# Patient Record
Sex: Female | Born: 1975
Health system: Southern US, Community
[De-identification: ages and names within clinical notes are randomized; demographics above are authoritative.]

## PROBLEM LIST (undated history)

## (undated) DIAGNOSIS — I10 Essential (primary) hypertension: Secondary | ICD-10-CM

## (undated) HISTORY — DX: Essential (primary) hypertension: I10

---

## 2016-11-03 NOTE — L&D Delivery Note (Addendum)
Delivery Note At 7:07 PM a viable female "Karina Underwood" was delivered precipitously via Vaginal, Spontaneous Delivery by Bernerd PhoNancy Dala Breault CNM (Presentation: OA). APGARS: 9, 9; weight 8 lbs 12.8 oz (3992 g).   Placenta status: Spontaneous, intact. Cord: 3 vessels with the following complications: Loose nuchal, manually reduced. Cord pH: NA  Anesthesia: Local for repair Episiotomy: NA  Lacerations: 1st degree   Suture Repair: 2-0 Vicryl Est. Blood Loss (mL): 200  Mom to postpartum. Baby to Couplet care / Skin to Skin.  Sherre ScarletWILLIAMS, KIMBERLY 01/23/2017, 7:13 PM

## 2017-01-23 ENCOUNTER — Encounter (HOSPITAL_COMMUNITY): Payer: Self-pay | Admitting: *Deleted

## 2017-01-23 ENCOUNTER — Inpatient Hospital Stay (HOSPITAL_COMMUNITY)
Admission: AD | Admit: 2017-01-23 | Discharge: 2017-01-25 | DRG: 774 | Disposition: A | Discharge status: Home / Self Care | Payer: BLUE CROSS/BLUE SHIELD | Source: Ambulatory Visit | Attending: Obstetrics and Gynecology | Admitting: Obstetrics and Gynecology

## 2017-01-23 DIAGNOSIS — O99824 Streptococcus B carrier state complicating childbirth: Secondary | ICD-10-CM | POA: Diagnosis present

## 2017-01-23 DIAGNOSIS — O480 Post-term pregnancy: Secondary | ICD-10-CM

## 2017-01-23 DIAGNOSIS — A6000 Herpesviral infection of urogenital system, unspecified: Secondary | ICD-10-CM | POA: Diagnosis present

## 2017-01-23 DIAGNOSIS — Z6791 Unspecified blood type, Rh negative: Secondary | ICD-10-CM

## 2017-01-23 DIAGNOSIS — O134 Gestational [pregnancy-induced] hypertension without significant proteinuria, complicating childbirth: Secondary | ICD-10-CM | POA: Diagnosis present

## 2017-01-23 DIAGNOSIS — O9832 Other infections with a predominantly sexual mode of transmission complicating childbirth: Secondary | ICD-10-CM | POA: Diagnosis present

## 2017-01-23 DIAGNOSIS — Z3A40 40 weeks gestation of pregnancy: Secondary | ICD-10-CM

## 2017-01-23 DIAGNOSIS — O09519 Supervision of elderly primigravida, unspecified trimester: Secondary | ICD-10-CM

## 2017-01-23 DIAGNOSIS — B009 Herpesviral infection, unspecified: Secondary | ICD-10-CM

## 2017-01-23 DIAGNOSIS — Z3493 Encounter for supervision of normal pregnancy, unspecified, third trimester: Secondary | ICD-10-CM | POA: Diagnosis present

## 2017-01-23 DIAGNOSIS — E669 Obesity, unspecified: Secondary | ICD-10-CM

## 2017-01-23 DIAGNOSIS — O26899 Other specified pregnancy related conditions, unspecified trimester: Secondary | ICD-10-CM

## 2017-01-23 DIAGNOSIS — B951 Streptococcus, group B, as the cause of diseases classified elsewhere: Secondary | ICD-10-CM

## 2017-01-23 DIAGNOSIS — O479 False labor, unspecified: Secondary | ICD-10-CM | POA: Diagnosis present

## 2017-01-23 DIAGNOSIS — O99214 Obesity complicating childbirth: Secondary | ICD-10-CM | POA: Diagnosis present

## 2017-01-23 DIAGNOSIS — O139 Gestational [pregnancy-induced] hypertension without significant proteinuria, unspecified trimester: Secondary | ICD-10-CM

## 2017-01-23 LAB — CBC
HCT: 38.3 % (ref 36.0–46.0)
Hemoglobin: 12.6 g/dL (ref 12.0–15.0)
MCH: 29.9 pg (ref 26.0–34.0)
MCHC: 32.9 g/dL (ref 30.0–36.0)
MCV: 90.8 fL (ref 78.0–100.0)
Platelets: 236 10*3/uL (ref 150–400)
RBC: 4.22 MIL/uL (ref 3.87–5.11)
RDW: 14.1 % (ref 11.5–15.5)
WBC: 21.2 10*3/uL — ABNORMAL HIGH (ref 4.0–10.5)

## 2017-01-23 LAB — OB RESULTS CONSOLE RUBELLA ANTIBODY, IGM: Rubella: IMMUNE

## 2017-01-23 LAB — OB RESULTS CONSOLE ANTIBODY SCREEN: Antibody Screen: POSITIVE

## 2017-01-23 LAB — OB RESULTS CONSOLE GBS: GBS: POSITIVE

## 2017-01-23 LAB — TYPE AND SCREEN
ABO/RH(D): A NEG
Antibody Screen: NEGATIVE

## 2017-01-23 LAB — OB RESULTS CONSOLE ABO/RH: RH Type: NEGATIVE

## 2017-01-23 LAB — OB RESULTS CONSOLE GC/CHLAMYDIA
Chlamydia: NEGATIVE
Gonorrhea: NEGATIVE

## 2017-01-23 MED ORDER — ONDANSETRON HCL 4 MG/2ML IJ SOLN: 4.0000 mg | INTRAMUSCULAR | Status: DC | PRN

## 2017-01-23 MED ORDER — WITCH HAZEL-GLYCERIN EX PADS: 1.0000 "application " | MEDICATED_PAD | CUTANEOUS | Status: DC | PRN

## 2017-01-23 MED ORDER — ACETAMINOPHEN 325 MG PO TABS: 650.0000 mg | ORAL_TABLET | ORAL | Status: DC | PRN

## 2017-01-23 MED ORDER — OXYTOCIN 40 UNITS IN LACTATED RINGERS INFUSION - SIMPLE MED
2.5000 [IU]/h | INTRAVENOUS | Status: DC
  Administered 2017-01-23: 2.5 [IU]/h via INTRAVENOUS

## 2017-01-23 MED ORDER — LIDOCAINE HCL (PF) 1 % IJ SOLN
INTRAMUSCULAR | Status: AC
  Administered 2017-01-23: 30 mL via SUBCUTANEOUS
  Filled 2017-01-23: qty 30

## 2017-01-23 MED ORDER — IBUPROFEN 600 MG PO TABS
600.0000 mg | ORAL_TABLET | Freq: Four times a day (QID) | ORAL | Status: DC
  Administered 2017-01-24 – 2017-01-25 (×3): 600 mg via ORAL
  Filled 2017-01-23 (×6): qty 1

## 2017-01-23 MED ORDER — SENNOSIDES-DOCUSATE SODIUM 8.6-50 MG PO TABS
2.0000 | ORAL_TABLET | ORAL | Status: DC
  Administered 2017-01-24: 2 via ORAL
  Filled 2017-01-23 (×2): qty 2

## 2017-01-23 MED ORDER — LIDOCAINE HCL (PF) 1 % IJ SOLN
30.0000 mL | INTRAMUSCULAR | Status: DC | PRN
  Administered 2017-01-23: 30 mL via SUBCUTANEOUS
  Filled 2017-01-23: qty 30

## 2017-01-23 MED ORDER — DIPHENHYDRAMINE HCL 25 MG PO CAPS: 25.0000 mg | ORAL_CAPSULE | Freq: Four times a day (QID) | ORAL | Status: DC | PRN

## 2017-01-23 MED ORDER — BENZOCAINE-MENTHOL 20-0.5 % EX AERO
1.0000 "application " | INHALATION_SPRAY | CUTANEOUS | Status: DC | PRN
  Filled 2017-01-23: qty 56

## 2017-01-23 MED ORDER — OXYCODONE-ACETAMINOPHEN 5-325 MG PO TABS: 2.0000 | ORAL_TABLET | ORAL | Status: DC | PRN

## 2017-01-23 MED ORDER — ONDANSETRON HCL 4 MG/2ML IJ SOLN: 4.0000 mg | Freq: Four times a day (QID) | INTRAMUSCULAR | Status: DC | PRN

## 2017-01-23 MED ORDER — SOD CITRATE-CITRIC ACID 500-334 MG/5ML PO SOLN: 30.0000 mL | ORAL | Status: DC | PRN

## 2017-01-23 MED ORDER — OXYCODONE-ACETAMINOPHEN 5-325 MG PO TABS: 1.0000 | ORAL_TABLET | ORAL | Status: DC | PRN

## 2017-01-23 MED ORDER — ONDANSETRON HCL 4 MG PO TABS: 4.0000 mg | ORAL_TABLET | ORAL | Status: DC | PRN

## 2017-01-23 MED ORDER — LACTATED RINGERS IV SOLN: 500.0000 mL | INTRAVENOUS | Status: DC | PRN

## 2017-01-23 MED ORDER — LACTATED RINGERS IV SOLN
INTRAVENOUS | Status: DC
  Administered 2017-01-23: 125 mL/h via INTRAVENOUS

## 2017-01-23 MED ORDER — TETANUS-DIPHTH-ACELL PERTUSSIS 5-2.5-18.5 LF-MCG/0.5 IM SUSP: 0.5000 mL | Freq: Once | INTRAMUSCULAR | Status: DC

## 2017-01-23 MED ORDER — SODIUM CHLORIDE 0.9 % IV SOLN
2.0000 g | Freq: Once | INTRAVENOUS | Status: AC
  Administered 2017-01-23: 2 g via INTRAVENOUS
  Filled 2017-01-23: qty 2000

## 2017-01-23 MED ORDER — PRENATAL MULTIVITAMIN CH
1.0000 | ORAL_TABLET | Freq: Every day | ORAL | Status: DC
  Administered 2017-01-24 – 2017-01-25 (×2): 1 via ORAL
  Filled 2017-01-23 (×2): qty 1

## 2017-01-23 MED ORDER — ZOLPIDEM TARTRATE 5 MG PO TABS: 5.0000 mg | ORAL_TABLET | Freq: Every evening | ORAL | Status: DC | PRN

## 2017-01-23 MED ORDER — OXYTOCIN BOLUS FROM INFUSION
500.0000 mL | Freq: Once | INTRAVENOUS | Status: AC
  Administered 2017-01-23: 500 mL via INTRAVENOUS

## 2017-01-23 MED ORDER — COCONUT OIL OIL
1.0000 "application " | TOPICAL_OIL | Status: DC | PRN
  Administered 2017-01-24: 1 via TOPICAL
  Filled 2017-01-23: qty 120

## 2017-01-23 MED ORDER — DIBUCAINE 1 % RE OINT: 1.0000 "application " | TOPICAL_OINTMENT | RECTAL | Status: DC | PRN

## 2017-01-23 MED ORDER — OXYTOCIN 40 UNITS IN LACTATED RINGERS INFUSION - SIMPLE MED
INTRAVENOUS | Status: AC
  Administered 2017-01-23: 2.5 [IU]/h via INTRAVENOUS
  Filled 2017-01-23: qty 1000

## 2017-01-23 MED ORDER — SIMETHICONE 80 MG PO CHEW: 80.0000 mg | CHEWABLE_TABLET | ORAL | Status: DC | PRN

## 2017-01-23 NOTE — MAU Note (Signed)
Patient brought straight to triage from lobby. Contractions every 2 minutes; bloody show. Denies LOF. SVE 9cm BBOW. Patient taken directly to admit bed; MD called and made aware of patient arrival and status. FHR 150's.

## 2017-01-23 NOTE — Progress Notes (Signed)
Delivery of live viable female by Fran LowesNancy P, CNM

## 2017-01-23 NOTE — H&P (Signed)
Karina Underwood is a 41 y.o. female, G3P1011 at 40.6 weeks, presenting for active labor.  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  FM+  Denies leaking of fluid  Patient Active Problem List   Diagnosis Date Noted  . Threatened labor at term 01/23/2017  . Vaginal delivery 01/23/2017    History of present pregnancy: Patient entered care at 9.2 weeks.   EDC of 01/17/2017 was established by LMP.   Anatomy scan:  19.6 weeks, with normal findings and an posterior placenta.   Additional US evaluations:  None.   Significant prenatal events:  None   Last evaluation:  Last week  OB History    Gravida Para Term Preterm AB Living   1             SAB TAB Ectopic Multiple Live Births                 No past medical history on file. No past surgical history on file. Family History: family history is not on file. Social History:  has no tobacco, alcohol, and drug history on file.   Prenatal Transfer Tool  Maternal Diabetes: No Genetic Screening: Declined Maternal Ultrasounds/Referrals: Normal Fetal Ultrasounds or other Referrals:  None Maternal Substance Abuse:  No Significant Maternal Medications:  Meds include: Other: Valtrex Significant Maternal Lab Results: None  TDAP No Flu No  ROS:  Reviewed all 10 systems and negative expect as stated above  No Known Allergies   Dilation: 10 Station: Crowning Exam by:: CNM Karina Underwood Blood pressure (!) 155/97, pulse 80, resp. rate 20.  Chest clear Heart RRR without murmur Abd gravid, NT, FH appropriate Pelvic: adequate Ext: NEg  FHR: Category 1 UCs:  3 in 10 minutes  Prenatal labs: ABO, Rh:  A ngative  Antibody:  Neg Rubella:  Immune RPR:   NR HBsAg:   Neg HIV:   NR GBS:  Positive  Pap:  2016 negative GC:  Neg Chlamydia:  Neg Genetic screenings:  declined Glucola:  112 Other:   Hgb 12.9 at NOB, 11.2 at 28 weeks       Assessment/Plan: IUP at 40.6  Cat 1 strip GBS  positive  Plan: Admit to Birthing Suite per consult with Rivard Routine [facility] orders  PCN G for GBS prophylaxis Ampicillin  Henderson NewcomerNancy Jean ProtheroCNM, MSN 01/23/2017, 7:25 PM

## 2017-01-23 NOTE — Progress Notes (Signed)
Dr Estanislado Pandyivard notified that patient is a G1P0 at 36109w6d and here 9cm dilated with a BBOW.  Dr Estanislado Pandyivard will call the midwife to deliver and patient headed to labor and delivery.

## 2017-01-24 ENCOUNTER — Encounter (HOSPITAL_COMMUNITY): Payer: Self-pay | Admitting: *Deleted

## 2017-01-24 LAB — COMPREHENSIVE METABOLIC PANEL
ALT: 16 U/L (ref 14–54)
AST: 30 U/L (ref 15–41)
Albumin: 2.6 g/dL — ABNORMAL LOW (ref 3.5–5.0)
Alkaline Phosphatase: 90 U/L (ref 38–126)
Anion gap: 9 (ref 5–15)
BUN: 12 mg/dL (ref 6–20)
CO2: 21 mmol/L — ABNORMAL LOW (ref 22–32)
Calcium: 8.6 mg/dL — ABNORMAL LOW (ref 8.9–10.3)
Chloride: 103 mmol/L (ref 101–111)
Creatinine, Ser: 0.68 mg/dL (ref 0.44–1.00)
GFR calc Af Amer: >60 mL/min (ref >60)
GFR calc non Af Amer: >60 mL/min (ref >60)
Glucose, Bld: 127 mg/dL — ABNORMAL HIGH (ref 65–99)
Potassium: 3.9 mmol/L (ref 3.5–5.1)
Sodium: 133 mmol/L — ABNORMAL LOW (ref 135–145)
Total Bilirubin: 0.3 mg/dL (ref 0.3–1.2)
Total Protein: 6.3 g/dL — ABNORMAL LOW (ref 6.5–8.1)

## 2017-01-24 LAB — CBC
HCT: 32.7 % — ABNORMAL LOW (ref 36.0–46.0)
Hemoglobin: 11 g/dL — ABNORMAL LOW (ref 12.0–15.0)
MCH: 30.4 pg (ref 26.0–34.0)
MCHC: 33.6 g/dL (ref 30.0–36.0)
MCV: 90.3 fL (ref 78.0–100.0)
Platelets: 207 10*3/uL (ref 150–400)
RBC: 3.62 MIL/uL — ABNORMAL LOW (ref 3.87–5.11)
RDW: 14 % (ref 11.5–15.5)
WBC: 16.1 10*3/uL — ABNORMAL HIGH (ref 4.0–10.5)

## 2017-01-24 LAB — LACTATE DEHYDROGENASE: LDH: 142 U/L (ref 98–192)

## 2017-01-24 LAB — RPR: RPR Ser Ql: NONREACTIVE

## 2017-01-24 LAB — URIC ACID: Uric Acid, Serum: 5.9 mg/dL (ref 2.3–6.6)

## 2017-01-24 LAB — ABO/RH: ABO/RH(D): A NEG

## 2017-01-24 MED ORDER — RHO D IMMUNE GLOBULIN 1500 UNIT/2ML IJ SOSY
300.0000 ug | PREFILLED_SYRINGE | Freq: Once | INTRAMUSCULAR | Status: AC
  Administered 2017-01-24: 300 ug via INTRAMUSCULAR
  Filled 2017-01-24: qty 2

## 2017-01-24 NOTE — Lactation Note (Signed)
This note was copied from a baby's chart. Lactation Consultation Note  Patient Name: Karina Underwood XBJYN'WToday's Date: 01/24/2017 Reason for consult: Initial assessment  Visited with Mom, baby 5518 hrs old and has had >4 feedings at breast so far.  Mom with visitors in room, and would like to have LC observe and assess a feeding.  Mom to call back at next feeding. Brochure left with Mom.     Consult Status Consult Status: Follow-up Date: 01/24/17 Follow-up type: In-patient    Judee ClaraSmith, Darene Nappi E 01/24/2017, 1:24 PM

## 2017-01-24 NOTE — Lactation Note (Signed)
This note was copied from a baby's chart. Lactation Consultation Note  Patient Name: Karina Perley JainLisa Cull JXBJY'NToday's Date: 01/24/2017 Reason for consult: Follow-up assessment;Difficult latch  Baby 27 hours old. Called to room to assess baby's oral anatomy and latch. Mom reports that her nipples are very sore, and mom's left nipple has two blisters and is pinched/lipstick tip. Assisted mom to latch baby to left breast in football position and baby latched deeply, suckled rhythmically with lips flanged, but quickly became tired at breast and slipped to the tip of mom's nipple. Discussed with parents that baby's tongue appears to be restricted. FOB showed this LC his tongue and stated that his tongue is very tight. Enc parents to discuss with pediatrician in the morning, and especially if baby still having trouble when mom's milk comes to volume. Enc parents to practice tummy time to encourage relaxing of muscles.   Discussed the need to pump to protect mom's milk supply. Set mom up with DEBP and demonstrated how to assemble, disassemble and clean parts. Mom states that she has a personal pump, and she is aware of the benefits of a hospital-grade pump--and hospital rental. Assisted mom with hand expression with no colostrum present, and mom not flowing during pumping. Plan is for mom to put baby to breast with cues, then supplement with EBM/formula according to guidelines--which were given with review. Enc mom to post-pump followed by hand expression.   Discussed assessment and intervention with patient's bedside nurse, Jan, RN.   Maternal Data Has patient been taught Hand Expression?: Yes Does the patient have breastfeeding experience prior to this delivery?: No  Feeding Feeding Type: Breast Fed Length of feed: 5 min  LATCH Score/Interventions Latch: Repeated attempts needed to sustain latch, nipple held in mouth throughout feeding, stimulation needed to elicit sucking reflex. Intervention(s): Adjust  position;Assist with latch;Breast compression  Audible Swallowing: A few with stimulation Intervention(s): Skin to skin;Hand expression  Type of Nipple: Everted at rest and after stimulation  Comfort (Breast/Nipple): Filling, red/small blisters or bruises, mild/mod discomfort  Problem noted: Cracked, bleeding, blisters, bruises Interventions  (Cracked/bleeding/bruising/blister): Double electric pump  Hold (Positioning): Assistance needed to correctly position infant at breast and maintain latch. Intervention(s): Breastfeeding basics reviewed;Support Pillows;Position options;Skin to skin  LATCH Score: 6  Lactation Tools Discussed/Used WIC Program: No Pump Review: Setup, frequency, and cleaning;Milk Storage Initiated by:: JW Date initiated:: 01/24/17   Consult Status Consult Status: Follow-up Date: 01/25/17 Follow-up type: In-patient    Sherlyn HayJennifer D Keishana Klinger 01/24/2017, 10:39 PM

## 2017-01-24 NOTE — Progress Notes (Signed)
Spoke with Bernerd PhoNancy Prothero, CNM and inquired whether or not we still needed the protein-creatinine ratio. Was instructed that since other labs were ok and blood pressures have not been excessively high, that she no longer needed the protein creatinine ratio. Order discontinued. Sherald BargeMatthews, Eliza Grissinger L

## 2017-01-24 NOTE — Progress Notes (Addendum)
Subjective: Postpartum Day 1: Vaginal delivery, 1st vaginal laceration Patient up ad lib, reports no syncope or dizziness.  Denies headache, blurred vision or epigastric pain Feeding:  Breastfeeding Contraceptive plan:  Undecided  Objective: Vital signs in last 24 hours: Temp:  [97.8 F (36.6 C)-99 F (37.2 C)] 99 F (37.2 C) (03/24 0601) Pulse Rate:  [64-87] 67 (03/24 0601) Resp:  [16-22] 18 (03/24 0601) BP: (125-155)/(62-97) 141/76 (03/24 0601) Weight:  [99.8 kg (220 lb)] 99.8 kg (220 lb) (03/23 2000)  Physical Exam:  General: alert, cooperative and no distress Lochia: appropriate Uterine Fundus: firm Perineum: healing well DVT Evaluation: No evidence of DVT seen on physical exam. Negative Homan's sign.   CBC Latest Ref Rng & Units 01/24/2017 01/23/2017  WBC 4.0 - 10.5 K/uL 16.1(H) 21.2(H)  Hemoglobin 12.0 - 15.0 g/dL 11.0(L) 12.6  Hematocrit 36.0 - 46.0 % 32.7(L) 38.3  Platelets 150 - 400 K/uL 207 236   CMP and UPC pending Results for orders placed or performed during the hospital encounter of 01/23/17 (from the past 24 hour(s))  CBC     Status: Abnormal   Collection Time: 01/23/17  8:22 PM  Result Value Ref Range   WBC 21.2 (H) 4.0 - 10.5 K/uL   RBC 4.22 3.87 - 5.11 MIL/uL   Hemoglobin 12.6 12.0 - 15.0 g/dL   HCT 09.838.3 11.936.0 - 14.746.0 %   MCV 90.8 78.0 - 100.0 fL   MCH 29.9 26.0 - 34.0 pg   MCHC 32.9 30.0 - 36.0 g/dL   RDW 82.914.1 56.211.5 - 13.015.5 %   Platelets 236 150 - 400 K/uL  Type and screen [facility]     Status: None   Collection Time: 01/23/17  8:22 PM  Result Value Ref Range   ABO/RH(D) A NEG    Antibody Screen NEG    Sample Expiration 01/26/2017   ABO/Rh     Status: None   Collection Time: 01/23/17  8:22 PM  Result Value Ref Range   ABO/RH(D) A NEG   CBC     Status: Abnormal   Collection Time: 01/24/17  5:45 AM  Result Value Ref Range   WBC 16.1 (H) 4.0 - 10.5 K/uL   RBC 3.62 (L) 3.87 - 5.11 MIL/uL   Hemoglobin 11.0 (L) 12.0 - 15.0 g/dL    HCT 86.532.7 (L) 78.436.0 - 46.0 %   MCV 90.3 78.0 - 100.0 fL   MCH 30.4 26.0 - 34.0 pg   MCHC 33.6 30.0 - 36.0 g/dL   RDW 69.614.0 29.511.5 - 28.415.5 %   Platelets 207 150 - 400 K/uL  Rh IG workup (includes ABO/Rh)     Status: None (Preliminary result)   Collection Time: 01/24/17  5:45 AM  Result Value Ref Range   Gestational Age(Wks) 39    ABO/RH(D) A NEG    Fetal Screen NEG    Unit Number [id number]    Blood Component Type RHIG    Unit division 00    Status of Unit ALLOCATED    Transfusion Status OK TO TRANSFUSE   Comprehensive metabolic panel     Status: Abnormal   Collection Time: 01/24/17  7:55 AM  Result Value Ref Range   Sodium 133 (L) 135 - 145 mmol/L   Potassium 3.9 3.5 - 5.1 mmol/L   Chloride 103 101 - 111 mmol/L   CO2 21 (L) 22 - 32 mmol/L   Glucose, Bld 127 (H) 65 - 99 mg/dL   BUN 12 6 - 20 mg/dL  Creatinine, Ser 0.68 0.44 - 1.00 mg/dL   Calcium 8.6 (L) 8.9 - 10.3 mg/dL   Total Protein 6.3 (L) 6.5 - 8.1 g/dL   Albumin 2.6 (L) 3.5 - 5.0 g/dL   AST 30 15 - 41 U/L   ALT 16 14 - 54 U/L   Alkaline Phosphatase 90 38 - 126 U/L   Total Bilirubin 0.3 0.3 - 1.2 mg/dL   GFR calc non Af Amer >60 >60 mL/min   GFR calc Af Amer >60 >60 mL/min   Anion gap 9 5 - 15  Lactate dehydrogenase     Status: None   Collection Time: 01/24/17  7:55 AM  Result Value Ref Range   LDH 142 98 - 192 U/L  Uric acid     Status: None   Collection Time: 01/24/17  7:55 AM  Result Value Ref Range   Uric Acid, Serum 5.9 2.3 - 6.6 mg/dL    Assessment/Plan: Status post vaginal delivery day 1. Stable Continue current care. Plan for discharge tomorrow, Breastfeeding and Lactation consult    Henderson Newcomer ProtheroCNM 01/24/2017, 8:51 AM

## 2017-01-25 ENCOUNTER — Encounter (HOSPITAL_COMMUNITY): Payer: Self-pay | Admitting: Obstetrics and Gynecology

## 2017-01-25 LAB — RH IG WORKUP (INCLUDES ABO/RH)
ABO/RH(D): A NEG
Fetal Screen: NEGATIVE
Gestational Age(Wks): 39
Unit division: 0

## 2017-01-25 NOTE — Lactation Note (Signed)
This note was copied from a baby's chart. Lactation Consultation Note: assisted mom with first breast feeding after frenotomy. Mom reports no pain with latch until baby slid some towards nipple. On and off a few times then nursing well with no pain except uterine cramping. Nipple slightly pinched after nursing but mom reports much better than before, Latched to other breast- again took a few attempts then nursed well for 15 min. Nipple slightly pinched but again mom states much better than before. Baby content after nursing. Mom states this is the only time she has been content after nursing- usually still hungry. Parents pleased they had procedure done here. No questions at present. Reviewed our phone number, OP appointments and BFSG as resources for support after DC. To call prn  Patient Name: Karina Underwood ZOXWR'UPerley Jainoday's Date: 01/25/2017 Reason for consult: Follow-up assessment   Maternal Data Formula Feeding for Exclusion: No Has patient been taught Hand Expression?: Yes  Feeding Feeding Type: Breast Fed Length of feed: 30 min  LATCH Score/Interventions Latch: Grasps breast easily, tongue down, lips flanged, rhythmical sucking.  Audible Swallowing: A few with stimulation  Type of Nipple: Everted at rest and after stimulation  Comfort (Breast/Nipple): Filling, red/small blisters or bruises, mild/mod discomfort  Problem noted: Mild/Moderate discomfort  Hold (Positioning): Assistance needed to correctly position infant at breast and maintain latch. Intervention(s): Breastfeeding basics reviewed;Support Pillows  LATCH Score: 7  Lactation Tools Discussed/Used WIC Program: No   Consult Status Consult Status: Complete    Pamelia HoitWeeks, Roselina Burgueno D 01/25/2017, 2:05 PM

## 2017-01-25 NOTE — Discharge Summary (Signed)
OB Discharge Summary     Patient Name: Perley JainLisa Damman DOB: 04/29/1976 MRN: [id number]  Date of admission: 01/23/2017 Delivering MD: Kenney HousemanPROTHERO, Kamal Jurgens JEAN   Date of discharge: 01/25/2017  Admitting diagnosis: ctx 2mins apart Intrauterine pregnancy: 1359w6d     Secondary diagnosis:  Principal Problem:   Vaginal delivery Active Problems:   Rh negative, maternal   AMA (advanced maternal age) primigravida 35+   Obesity   Positive GBS test   HSV-1 (herpes simplex virus 1) infection   Gestational hypertension  Additional problems: None     Discharge diagnosis: Term Pregnancy Delivered                                                                                                Post partum procedures:rhogam  Augmentation: AROM  Complications: None  Hospital course:  Onset of Labor With Vaginal Delivery     41 y.o. yo [id number] at 7659w6d was admitted in Active Labor on 01/23/2017. Patient had an uncomplicated labor course as follows:  Membrane Rupture Time/Date: 6:40 PM ,01/23/2017   Intrapartum Procedures: Episiotomy: None [1]                                         Lacerations:  1st degree [2]  Patient had a delivery of a Viable infant. 01/23/2017  Information for the patient's newborn:  Tiajuana AmassHardin, Girl Makinley [[id number]][[id number]]       Pateint had an uncomplicated postpartum course.  She is ambulating, tolerating a regular diet, passing flatus, and urinating well. Patient is discharged home in stable condition on 01/25/17.   Physical exam  Vitals:   01/24/17 0601 01/24/17 1230 01/24/17 1823 01/25/17 0519  BP: (!) 141/76 (!) 142/75 131/73 132/66  Pulse: 67 68 64 73  Resp: 18 20 20 18   Temp: 99 F (37.2 C) 98.2 F (36.8 C) 98 F (36.7 C) 98.3 F (36.8 C)  TempSrc:  Oral Oral Oral  SpO2:   99%   Weight:      Height:       General: alert, cooperative and no distress Lochia: appropriate Uterine Fundus: firm Incision:N/A DVT Evaluation: No evidence of DVT seen on physical exam. Negative  Homan's sign. Labs: Lab Results  Component Value Date   WBC 16.1 (H) 01/24/2017   HGB 11.0 (L) 01/24/2017   HCT 32.7 (L) 01/24/2017   MCV 90.3 01/24/2017   PLT 207 01/24/2017   CMP Latest Ref Rng & Units 01/24/2017  Glucose 65 - 99 mg/dL 130(Q127(H)  BUN 6 - 20 mg/dL 12  Creatinine 6.570.44 - 8.461.00 mg/dL 9.620.68  Sodium 952135 - 841145 mmol/L 133(L)  Potassium 3.5 - 5.1 mmol/L 3.9  Chloride 101 - 111 mmol/L 103  CO2 22 - 32 mmol/L 21(L)  Calcium 8.9 - 10.3 mg/dL 3.2(G8.6(L)  Total Protein 6.5 - 8.1 g/dL 6.3(L)  Total Bilirubin 0.3 - 1.2 mg/dL 0.3  Alkaline Phos 38 - 126 U/L 90  AST 15 - 41 U/L 30  ALT 14 - 54 U/L 16  Discharge instruction: per After Visit Summary and "Baby and Me Booklet".  After visit meds:  Allergies as of 01/25/2017   No Known Allergies     Medication List    STOP taking these medications   CALCIUM MAGNESIUM PO   valACYclovir 500 MG tablet Commonly known as:  VALTREX     TAKE these medications   prenatal multivitamin Tabs tablet Take 1 tablet by mouth 3 (three) times daily.   Vitamin D3 5000 units Caps Take 5,000 Units by mouth daily.       Diet: routine diet  Activity: Advance as tolerated. Pelvic rest for 6 weeks.   Outpatient follow up:6 weeks Follow up Appt:No future appointments. Follow up Visit:No Follow-up on file.  Postpartum contraception: Undecided  Newborn Data: Live born female  Birth Weight: 8 lb 12.8 oz (3992 g) APGAR: 9, 9  Baby Feeding: Breast Disposition:home with mother   01/25/2017 Kenney Houseman, CNM

## 2017-01-25 NOTE — Lactation Note (Signed)
This note was copied from a baby's chart. Lactation Consultation Note: [employer] rental completed. Dr Ezequiel EssexGable has just clipped frenulum. Baby asleep on dad's chest. Mom reports baby fed just before procedure. Does not want to attempt latch at this time. To page when ready for assist with latch,  Patient Name: Karina Underwood EAVWU'JToday's Date: 01/25/2017 Reason for consult: Follow-up assessment   Maternal Data    Feeding Feeding Type: Formula  LATCH Score/Interventions                      Lactation Tools Discussed/Used     Consult Status Consult Status: Follow-up Date: 01/25/17 Follow-up type: In-patient    Pamelia HoitWeeks, Bandy Honaker D 01/25/2017, 10:52 AM

## 2017-01-25 NOTE — Lactation Note (Signed)
This note was copied from a baby's chart. Lactation Consultation Note: Mom reports she is pleased with present breast feeding plan. She is not putting the baby to the breast because her nipples are so sore. She has been pumping- did obtain a few drops this morning which is more than she has been obtaining. Has been finger feeding with a curved tip syringe. Wants [employer] rental before DC. Dad asking about [employer]y [employer] Lactation Support Tea which they already have. Encouraged frequent pumping and hand expression to promote a good milk supply. They plan to talk with ped this morning about baby's tongue restriction. Baby asleep at this time on mom's chest. Offered an OP appointment with [place] but mom wants to go home and see how her nipples heal and then will call us if wants appointment. No questions at present.   Patient Name: Karina Underwood WUJWJ'X Date: 01/25/2017 Reason for consult: Follow-up assessment   Maternal Data    Feeding    LATCH Score/Interventions                      Lactation Tools Discussed/Used     Consult Status Consult Status: Follow-up Date: 01/25/17 Follow-up type: In-patient    Pamelia Hoit 01/25/2017, 9:00 AM

## 2018-12-10 ENCOUNTER — Other Ambulatory Visit: Payer: Self-pay | Admitting: Family Medicine

## 2018-12-10 DIAGNOSIS — Z1231 Encounter for screening mammogram for malignant neoplasm of breast: Secondary | ICD-10-CM

## 2019-01-12 ENCOUNTER — Ambulatory Visit
Admission: RE | Admit: 2019-01-12 | Discharge: 2019-01-12 | Disposition: A | Discharge status: Home / Self Care | Payer: BLUE CROSS/BLUE SHIELD | Source: Ambulatory Visit | Attending: Family Medicine | Admitting: Family Medicine

## 2019-01-12 ENCOUNTER — Other Ambulatory Visit: Payer: Self-pay

## 2019-01-12 DIAGNOSIS — Z1231 Encounter for screening mammogram for malignant neoplasm of breast: Secondary | ICD-10-CM

## 2019-04-21 ENCOUNTER — Emergency Department (HOSPITAL_COMMUNITY)
Admission: EM | Admit: 2019-04-21 | Discharge: 2019-04-21 | Disposition: A | Discharge status: Home / Self Care | Payer: BC Managed Care – PPO | Attending: Emergency Medicine | Admitting: Emergency Medicine

## 2019-04-21 ENCOUNTER — Other Ambulatory Visit: Payer: Self-pay

## 2019-04-21 DIAGNOSIS — Z79899 Other long term (current) drug therapy: Secondary | ICD-10-CM | POA: Diagnosis not present

## 2019-04-21 DIAGNOSIS — M62830 Muscle spasm of back: Secondary | ICD-10-CM

## 2019-04-21 DIAGNOSIS — M545 Low back pain, unspecified: Secondary | ICD-10-CM

## 2019-04-21 MED ORDER — METHOCARBAMOL 500 MG PO TABS: 1000.0000 mg | ORAL_TABLET | Freq: Four times a day (QID) | ORAL | 0 refills | Status: DC

## 2019-04-21 MED ORDER — KETOROLAC TROMETHAMINE 15 MG/ML IJ SOLN
15.0000 mg | Freq: Once | INTRAMUSCULAR | Status: AC
  Administered 2019-04-21: 15 mg via INTRAMUSCULAR
  Filled 2019-04-21: qty 1

## 2019-04-21 NOTE — ED Triage Notes (Signed)
C/o back pain radiating towards legs; stated was sitting on the floor for a while, got up and the pain started. Stated taken Tramadol 50 mg 1 tab, no relief

## 2019-04-21 NOTE — ED Notes (Signed)
Patient verbalizes understanding of discharge instructions. Opportunity for questioning and answers were provided. Armband removed by staff, pt discharged from ED.  

## 2019-04-21 NOTE — ED Provider Notes (Signed)
[facility] EMERGENCY DEPARTMENT Provider Note   CSN: [id number] Arrival date & time: 04/21/19  2253     History   Chief Complaint Chief Complaint  Patient presents with  . Back Pain    HPI Karina Underwood is a 43 y.o. female.     Patient presents to the emergency department with acute onset of back pain occurring around 7 PM tonight.  Patient states that she was sitting on the floor eating with her children when the pain gradually began.  Then worsened to the point where she could not walk.  She called her health insurance telehealth and was told to come to the emergency department.  She did take a tramadol that she had prescribed previously for similar symptoms.  She is able to drive herself to the emergency department.  Pain is in the lower back.  She has some mild radiation to her legs but it is mainly across her lower back on both sides.  No numbness or tingling. Patient denies warning symptoms of back pain including: fecal incontinence, urinary retention or overflow incontinence, night sweats, waking from sleep with back pain, unexplained fevers or weight loss, h/o cancer, IVDU, recent trauma.        No past medical history on file.  Patient Active Problem List   Diagnosis Date Noted  . Vaginal delivery 01/23/2017  . Rh negative, maternal 01/23/2017  . AMA (advanced maternal age) primigravida 35+ 01/23/2017  . Obesity 01/23/2017  . Positive GBS test 01/23/2017  . HSV-1 (herpes simplex virus 1) infection 01/23/2017  . Gestational hypertension 01/23/2017    No past surgical history on file.   OB History    Gravida  3   Para  2   Term  2   Preterm      AB  1   Living  2     SAB      TAB      Ectopic      Multiple  0   Live Births  2            Home Medications    Prior to Admission medications   Medication Sig Start Date End Date Taking? Authorizing Provider  Cholecalciferol (VITAMIN D3) 5000 units CAPS Take 5,000 Units  by mouth daily.    [provider]  Prenatal Vit-Fe Fumarate-FA (PRENATAL MULTIVITAMIN) TABS tablet Take 1 tablet by mouth 3 (three) times daily.    [provider]    Family History Family History  Problem Relation Age of Onset  . Breast cancer Maternal Grandmother     Social History Social History   Tobacco Use  . Smoking status: Never Smoker  . Smokeless tobacco: Never Used  Substance Use Topics  . Alcohol use: No  . Drug use: No     Allergies   Patient has no known allergies.   Review of Systems Review of Systems  Constitutional: Negative for fever and unexpected weight change.  Gastrointestinal: Negative for constipation.       Negative for fecal incontinence.   Genitourinary: Negative for dysuria, flank pain, hematuria, pelvic pain, vaginal bleeding and vaginal discharge.       Negative for urinary incontinence or retention.  Musculoskeletal: Positive for back pain.  Neurological: Negative for weakness and numbness.       Denies saddle paresthesias.     Physical Exam Updated Vital Signs BP 134/87 (BP Location: Right Arm)   Pulse 80   Temp 98 F (36.7  C) (Oral)   Resp 16   SpO2 100%   Physical Exam Vitals signs and nursing note reviewed.  Constitutional:      Appearance: She is well-developed.  HENT:     Head: Normocephalic and atraumatic.  Eyes:     Conjunctiva/sclera: Conjunctivae normal.  Neck:     Musculoskeletal: Normal range of motion and neck supple.  Pulmonary:     Effort: Pulmonary effort is normal.  Abdominal:     Palpations: Abdomen is soft.     Tenderness: There is no abdominal tenderness.  Musculoskeletal:     Cervical back: She exhibits normal range of motion, no tenderness and no bony tenderness.     Thoracic back: She exhibits normal range of motion, no tenderness and no bony tenderness.     Lumbar back: She exhibits decreased range of motion and tenderness. She exhibits no bony tenderness.     Comments: No  step-off noted with palpation of spine.  Decreased forward flexion due to pain and stiffness.  Pain is in the lumbar paraspinous musculature bilaterally, worsened with palpation.  Patient is standing upright in the room during history and physical exam.  She is ambulating without any difficulty.  Skin:    General: Skin is warm and dry.     Findings: No rash.  Neurological:     Mental Status: She is alert.     Sensory: No sensory deficit.     Deep Tendon Reflexes: Reflexes are normal and symmetric.     Comments: 5/5 strength in entire lower extremities bilaterally. No sensation deficit.       ED Treatments / Results  Labs (all labs ordered are listed, but only abnormal results are displayed) Labs Reviewed - No data to display  EKG    Radiology No results found.  Procedures Procedures (including critical care time)  Medications Ordered in ED Medications  ketorolac (TORADOL) 15 MG/ML injection 15 mg (15 mg Intramuscular Given 04/21/19 2340)     Initial Impression / Assessment and Plan / ED Course  I have reviewed the triage vital signs and the nursing notes.  Pertinent labs & imaging results that were available during my care of the patient were reviewed by me and considered in my medical decision making (see chart for details).        11:26 PM Patient seen and examined. Work-up initiated. Medications ordered.   Vital signs reviewed and are as follows: Vitals:   04/21/19 2301  BP: 134/87  Pulse: 80  Resp: 16  Temp: 98 F (36.7 C)  SpO2: 100%    No red flag s/s of low back pain. Patient was counseled on back pain precautions and told to do activity as tolerated but do not lift, push, or pull heavy objects more than 10 pounds for the next week.  Patient counseled to use ice or heat on back for no longer than 15 minutes every hour.   Patient counseled on proper use of muscle relaxant medication.  They were told not to drink alcohol, drive any vehicle, or do any  dangerous activities while taking this medication.  Patient verbalized understanding.  Patient urged to follow-up with PCP if pain does not improve with treatment and rest or if pain becomes recurrent. Urged to return with worsening severe pain, loss of bowel or bladder control, trouble walking.   The patient verbalizes understanding and agrees with the plan.   Final Clinical Impressions(s) / ED Diagnoses   Final diagnoses:  None   Patient  with back pain. No neurological deficits. Patient is ambulatory. No warning symptoms of back pain including: fecal incontinence, urinary retention or overflow incontinence, night sweats, waking from sleep with back pain, unexplained fevers or weight loss, h/o cancer, IVDU, recent trauma. No concern for cauda equina, epidural abscess, or other serious cause of back pain. Conservative measures such as rest, ice/heat and pain medicine indicated with PCP follow-up if no improvement with conservative management.   ED Discharge Orders         Ordered    methocarbamol (ROBAXIN) 500 MG tablet  4 times daily     04/21/19 2327           Renne CriglerGeiple, Shastina Rua, Cordelia Poche-C 04/21/19 2347    Zadie RhineWickline, Donald, MD 04/22/19 [phone]

## 2019-04-21 NOTE — Discharge Instructions (Signed)
Please read and follow all provided instructions.  Your diagnoses today include:  1. Acute bilateral low back pain without sciatica   2. Muscle spasm of back     Tests performed today include:  Vital signs - see below for your results today  Medications prescribed:   Robaxin (methocarbamol) - muscle relaxer medication  DO NOT drive or perform any activities that require you to be awake and alert because this medicine can make you drowsy.   Take any prescribed medications only as directed.  Home care instructions:   Follow any educational materials contained in this packet  Please rest, use ice or heat on your back for the next several days  Do not lift, push, pull anything more than 10 pounds for the next week  Follow-up instructions: Please follow-up with your primary care provider in the next few days for further evaluation of your symptoms if they are not improving.   Return instructions:  SEEK IMMEDIATE MEDICAL ATTENTION IF YOU HAVE:  New numbness, tingling, weakness, or problem with the use of your arms or legs  Severe back pain not relieved with medications  Loss control of your bowels or bladder  Increasing pain in any areas of the body (such as chest or abdominal pain)  Shortness of breath, dizziness, or fainting.   Worsening nausea (feeling sick to your stomach), vomiting, fever, or sweats  Any other emergent concerns regarding your health   Additional Information:  Your vital signs today were: BP 134/87 (BP Location: Right Arm)    Pulse 80    Temp 98 F (36.7 C) (Oral)    Resp 16    SpO2 100%  If your blood pressure (BP) was elevated above 135/85 this visit, please have this repeated by your doctor within one month. --------------

## 2020-02-27 ENCOUNTER — Other Ambulatory Visit: Payer: Self-pay | Admitting: Family Medicine

## 2020-02-27 DIAGNOSIS — Z1231 Encounter for screening mammogram for malignant neoplasm of breast: Secondary | ICD-10-CM

## 2020-04-09 ENCOUNTER — Ambulatory Visit
Admission: RE | Admit: 2020-04-09 | Discharge: 2020-04-09 | Disposition: A | Discharge status: Home / Self Care | Payer: Managed Care, Other (non HMO) | Source: Ambulatory Visit | Attending: Family Medicine | Admitting: Family Medicine

## 2020-04-09 ENCOUNTER — Other Ambulatory Visit: Payer: Self-pay

## 2020-04-09 ENCOUNTER — Ambulatory Visit: Payer: BC Managed Care – PPO

## 2020-04-09 DIAGNOSIS — Z1231 Encounter for screening mammogram for malignant neoplasm of breast: Secondary | ICD-10-CM

## 2021-03-27 ENCOUNTER — Other Ambulatory Visit: Payer: Self-pay | Admitting: Family Medicine

## 2021-03-27 DIAGNOSIS — Z1231 Encounter for screening mammogram for malignant neoplasm of breast: Secondary | ICD-10-CM

## 2021-05-24 ENCOUNTER — Other Ambulatory Visit: Payer: Self-pay

## 2021-05-24 ENCOUNTER — Ambulatory Visit
Admission: RE | Admit: 2021-05-24 | Discharge: 2021-05-24 | Disposition: A | Discharge status: Home / Self Care | Payer: Managed Care, Other (non HMO) | Source: Ambulatory Visit | Attending: Family Medicine | Admitting: Family Medicine

## 2021-05-24 DIAGNOSIS — Z1231 Encounter for screening mammogram for malignant neoplasm of breast: Secondary | ICD-10-CM

## 2022-08-04 ENCOUNTER — Other Ambulatory Visit: Payer: Self-pay | Admitting: Family Medicine

## 2022-08-04 DIAGNOSIS — Z1231 Encounter for screening mammogram for malignant neoplasm of breast: Secondary | ICD-10-CM

## 2022-08-22 ENCOUNTER — Ambulatory Visit: Payer: Managed Care, Other (non HMO)

## 2022-09-24 ENCOUNTER — Ambulatory Visit
Admission: RE | Admit: 2022-09-24 | Discharge: 2022-09-24 | Disposition: A | Discharge status: Home / Self Care | Payer: Managed Care, Other (non HMO) | Source: Ambulatory Visit | Attending: Family Medicine | Admitting: Family Medicine

## 2022-09-24 DIAGNOSIS — Z1231 Encounter for screening mammogram for malignant neoplasm of breast: Secondary | ICD-10-CM

## 2023-02-24 IMAGING — MG MM DIGITAL SCREENING BILAT W/ TOMO AND CAD
6 of 12 series · 6 of 36 positions shown · non-contrast
Comparison: Previous exam(s).

CLINICAL DATA: Screening.

EXAM:
DIGITAL SCREENING BILATERAL MAMMOGRAM WITH TOMOSYNTHESIS AND CAD
TECHNIQUE: Bilateral screening digital craniocaudal and mediolateral oblique
mammograms were obtained. Bilateral screening digital breast
tomosynthesis was performed. The images were evaluated with
computer-aided detection.

[R MLO synth-2D]
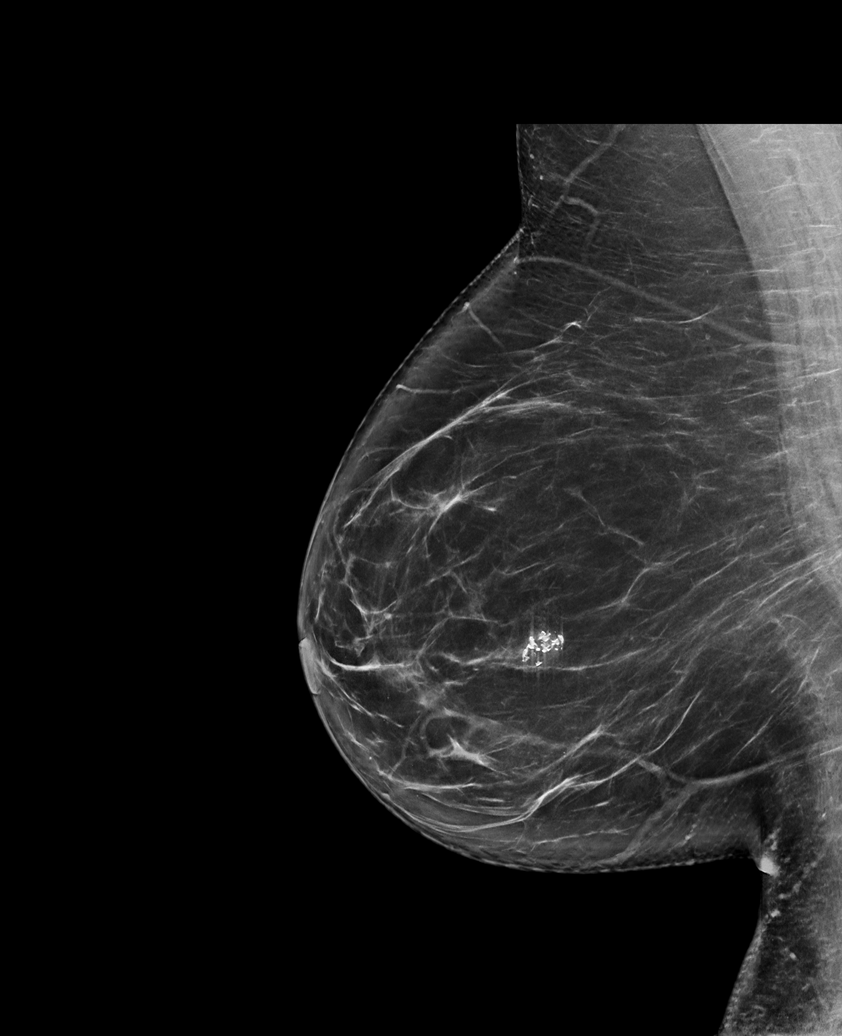

[L MLO synth-2D]
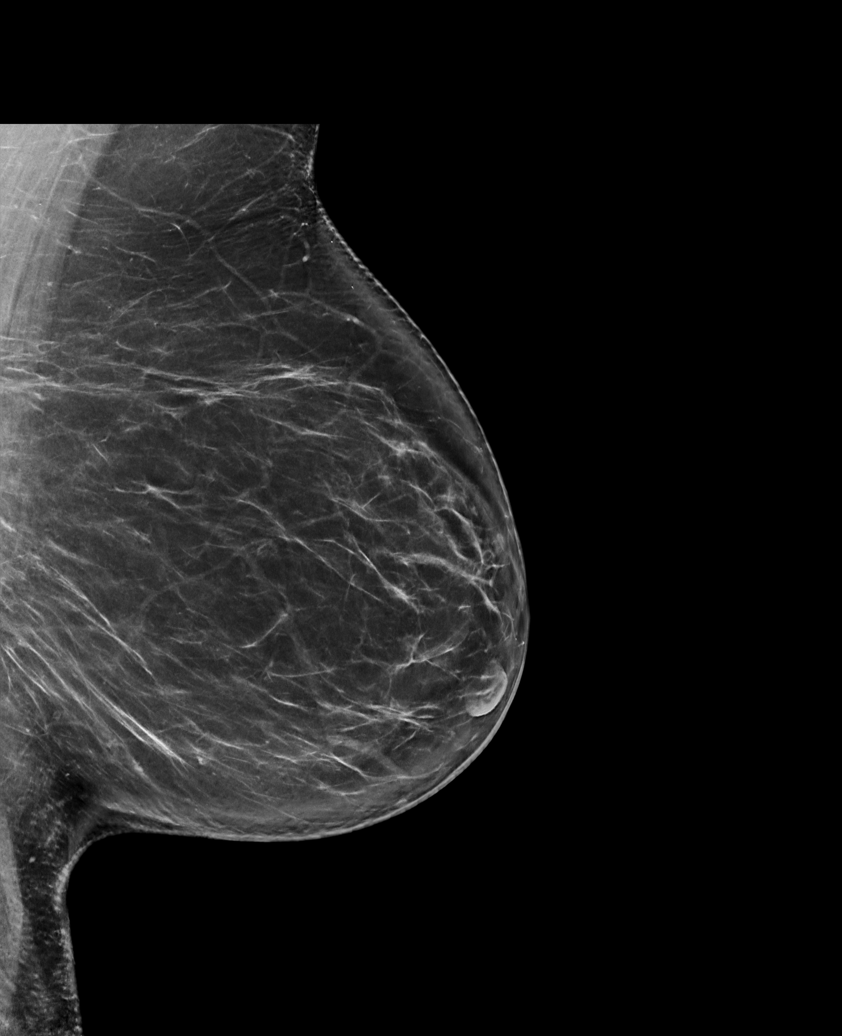

[R CC synth-2D (1 of 2)]
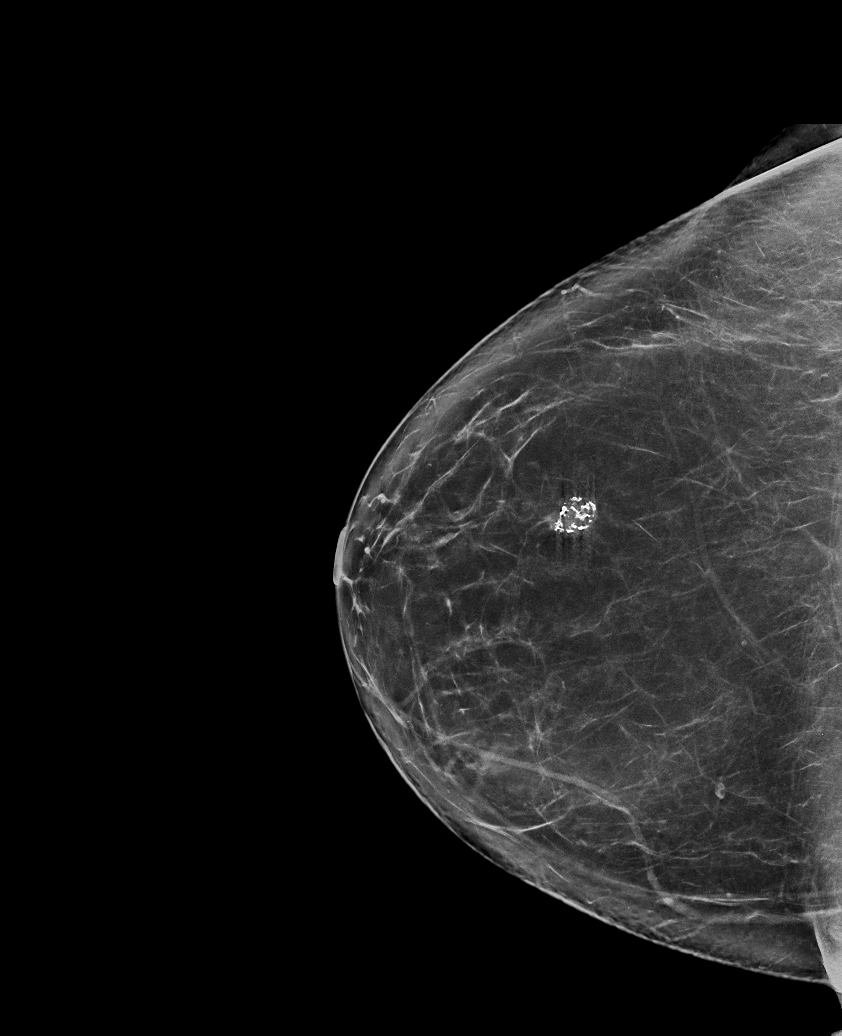

[L CC synth-2D (1 of 2)]
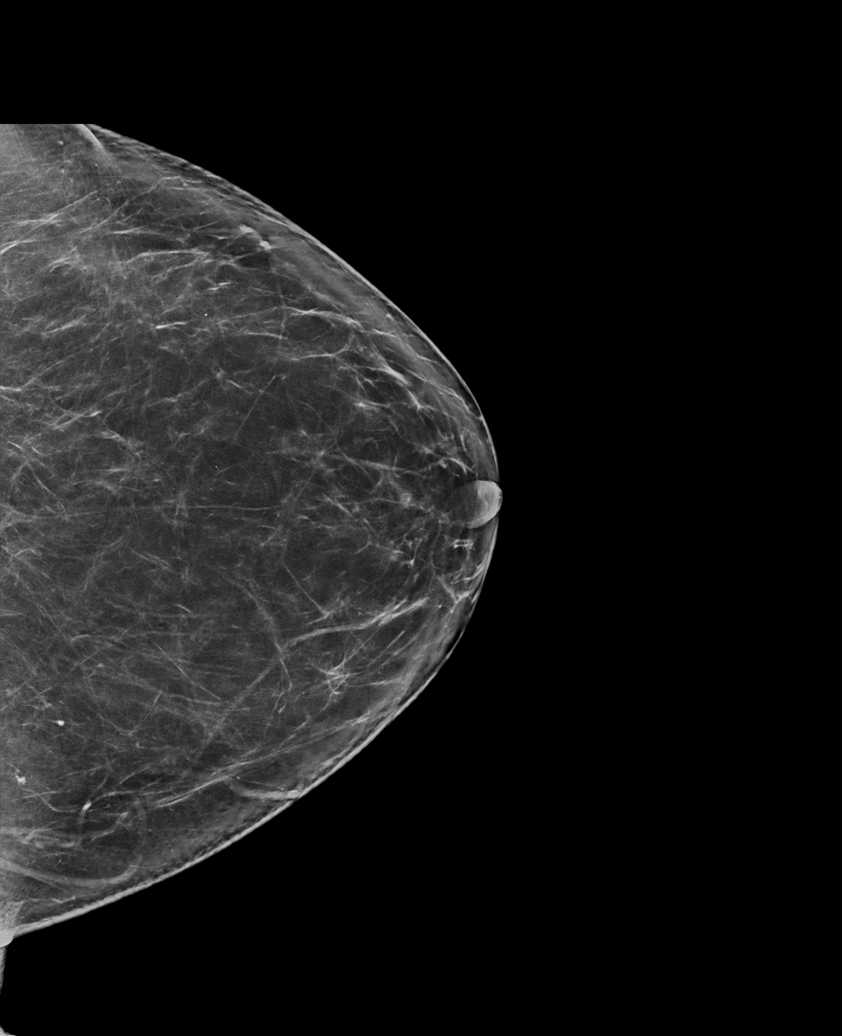

[L CC synth-2D (2 of 2)]
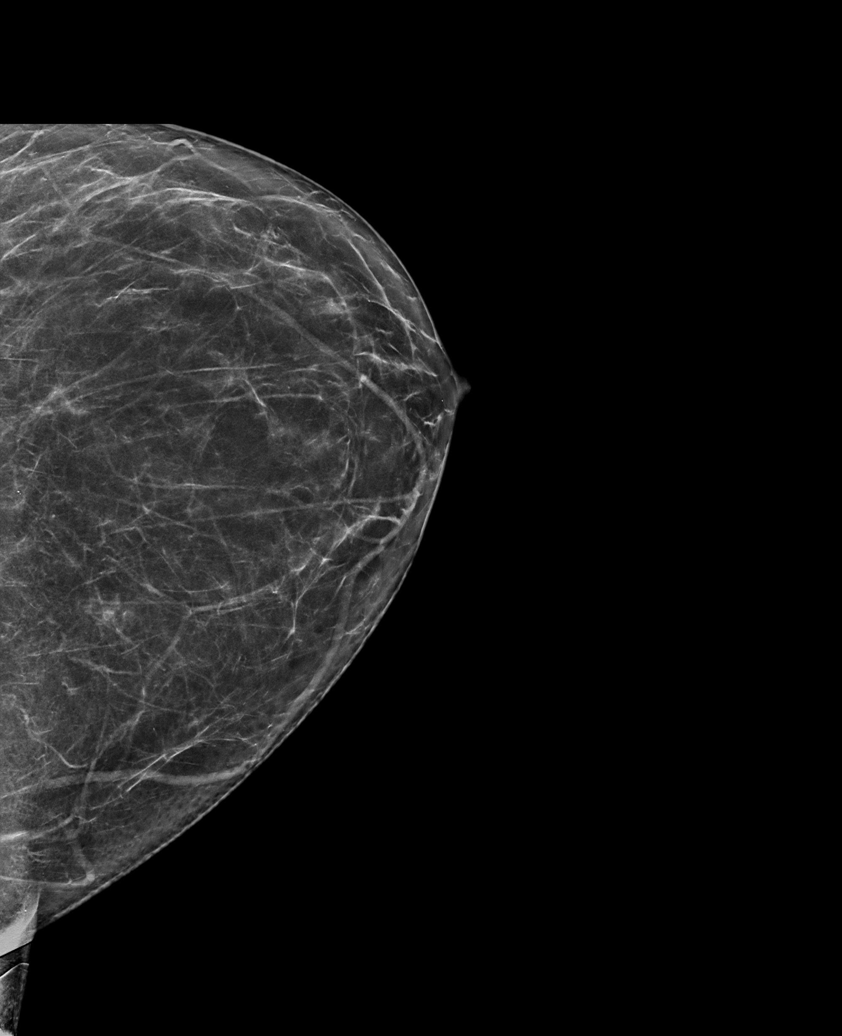

[R CC synth-2D (2 of 2)]
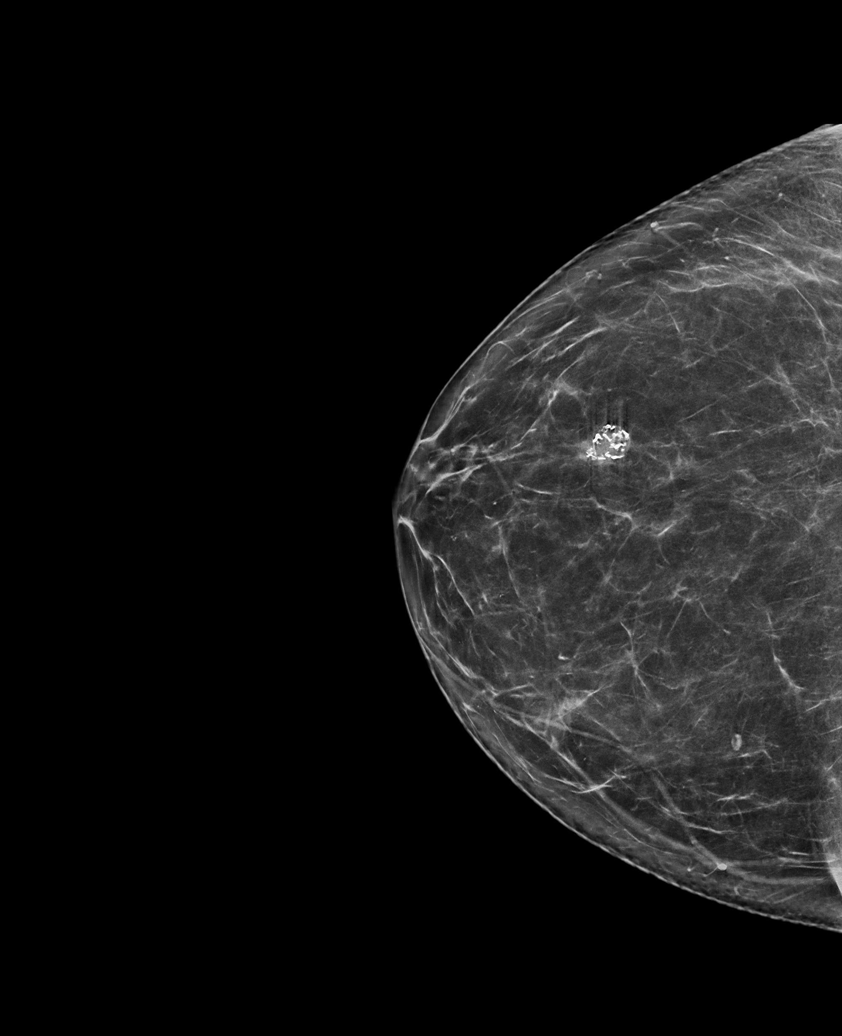

[6 of 36 positions shown; findings below may reference images not displayed]

ACR Breast Density Category b: There are scattered areas of
fibroglandular density.
FINDINGS: There are no findings suspicious for malignancy.
IMPRESSION: No mammographic evidence of malignancy. A result letter of this
screening mammogram will be mailed directly to the patient.

RECOMMENDATION:
Screening mammogram in one year. (Code:51-O-LD2)

BI-RADS CATEGORY  1: Negative.

## 2023-11-11 ENCOUNTER — Ambulatory Visit (HOSPITAL_COMMUNITY)
Admission: RE | Admit: 2023-11-11 | Discharge: 2023-11-11 | Disposition: A | Discharge status: Home / Self Care | Payer: Managed Care, Other (non HMO) | Source: Ambulatory Visit | Attending: Vascular Surgery | Admitting: Vascular Surgery

## 2023-11-11 ENCOUNTER — Other Ambulatory Visit (HOSPITAL_COMMUNITY): Payer: Self-pay | Admitting: Family Medicine

## 2023-11-11 DIAGNOSIS — M79662 Pain in left lower leg: Secondary | ICD-10-CM | POA: Diagnosis present

## 2023-11-18 ENCOUNTER — Other Ambulatory Visit: Payer: Self-pay | Admitting: *Deleted

## 2023-11-18 DIAGNOSIS — M79606 Pain in leg, unspecified: Secondary | ICD-10-CM

## 2023-11-20 ENCOUNTER — Ambulatory Visit (HOSPITAL_COMMUNITY)
Admission: RE | Admit: 2023-11-20 | Discharge: 2023-11-20 | Disposition: A | Discharge status: Home / Self Care | Payer: Managed Care, Other (non HMO) | Source: Ambulatory Visit | Attending: Vascular Surgery | Admitting: Vascular Surgery

## 2023-11-20 DIAGNOSIS — M79606 Pain in leg, unspecified: Secondary | ICD-10-CM | POA: Diagnosis present

## 2023-11-20 LAB — VAS US ABI WITH/WO TBI
Left ABI: 1.14
Right ABI: 1.25

## 2023-11-24 ENCOUNTER — Encounter: Payer: Managed Care, Other (non HMO) | Admitting: Vascular Surgery

## 2023-12-16 ENCOUNTER — Encounter: Payer: Self-pay | Admitting: Vascular Surgery

## 2023-12-16 ENCOUNTER — Ambulatory Visit: Payer: Managed Care, Other (non HMO) | Admitting: Vascular Surgery

## 2023-12-16 VITALS — BP 138/91 | HR 72 | Temp 98.1°F | Resp 20 | Ht 65.0 in | Wt 249.0 lb

## 2023-12-16 DIAGNOSIS — M79606 Pain in leg, unspecified: Secondary | ICD-10-CM | POA: Diagnosis not present

## 2023-12-16 NOTE — Progress Notes (Signed)
Patient ID: Karina Underwood, female   DOB: 07/18/1976, 47 y.o.   MRN: [id number]  Reason for Consult: New Patient (Initial Visit)   Referred by Inez Pilgrim, NP  Subjective:     HPI:  Karina Underwood is a 48 y.o. female had an episode of moderate to severe pain of her left calf approximately 5 weeks ago.  She was evaluated with DVT study which was negative and ultimately underwent ABIs.  She does not have any further pain has not had pain since the time of the initial episode.  She walks without limitation.  She denies any tissue loss or ulceration.  No previous history of vascular disease and she is a lifelong non-smoker.  Past Medical History:  Diagnosis Date   Hypertension    Family History  Problem Relation Age of Onset   Breast cancer Maternal Grandmother    History reviewed. No pertinent surgical history.  Short Social History:  Social History   Tobacco Use   Smoking status: Never   Smokeless tobacco: Never  Substance Use Topics   Alcohol use: No    No Known Allergies  Current Outpatient Medications  Medication Sig Dispense Refill   losartan (COZAAR) 50 MG tablet Take 50 mg by mouth daily.     Multiple Vitamin (MULTIVITAMIN) capsule Take 1 capsule by mouth daily.     phentermine (ADIPEX-P) 37.5 MG tablet Take 37.5 mg by mouth every morning.     topiramate (TOPAMAX) 25 MG tablet 1 tablet Orally Once a day for 30 days     No current facility-administered medications for this visit.    Review of Systems  Constitutional:  Constitutional negative. HENT: HENT negative.  Respiratory: Respiratory negative.  Cardiovascular: Cardiovascular negative.  GI: Gastrointestinal negative.  Musculoskeletal: Positive for leg pain.  Neurological: Neurological negative. Hematologic: Hematologic/lymphatic negative.  Psychiatric: Psychiatric negative.        Objective:  Objective   Vitals:   12/16/23 1413  BP: (!) 138/91  Pulse: 72  Resp: 20  Temp: 98.1 F (36.7 C)   SpO2: 99%  Weight: 249 lb (112.9 kg)  Height: 5\' 5"  (1.651 m)   Body mass index is 41.44 kg/m.  Physical Exam HENT:     Head: Normocephalic.     Nose: Nose normal.  Eyes:     Pupils: Pupils are equal, round, and reactive to light.  Cardiovascular:     Rate and Rhythm: Normal rate.     Pulses: Normal pulses.  Pulmonary:     Effort: Pulmonary effort is normal.  Abdominal:     General: Abdomen is flat.  Musculoskeletal:     Cervical back: Normal range of motion.     Right lower leg: No edema.     Left lower leg: No edema.  Skin:    Capillary Refill: Capillary refill takes less than 2 seconds.  Neurological:     General: No focal deficit present.     Mental Status: She is alert.  Psychiatric:        Mood and Affect: Mood normal.        Thought Content: Thought content normal.        Judgment: Judgment normal.     Data: ABI Findings:  +---------+------------------+-----+---------+--------+  Right   Rt Pressure (mmHg)IndexWaveform Comment   +---------+------------------+-----+---------+--------+  Brachial 125                                       +---------+------------------+-----+---------+--------+  PTA     166               1.25 triphasic          +---------+------------------+-----+---------+--------+  DP      154               1.16 triphasic          +---------+------------------+-----+---------+--------+  Great Toe80                0.60 Abnormal           +---------+------------------+-----+---------+--------+   +---------+------------------+-----+---------+-------+  Left    Lt Pressure (mmHg)IndexWaveform Comment  +---------+------------------+-----+---------+-------+  Brachial 133                                      +---------+------------------+-----+---------+-------+  PTA     152               1.14 triphasic         +---------+------------------+-----+---------+-------+  DP      146               1.10  triphasic         +---------+------------------+-----+---------+-------+  Great Toe91                0.68 Abnormal          +---------+------------------+-----+---------+-------+   +-------+-----------+-----------+------------+------------+  ABI/TBIToday's ABIToday's TBIPrevious ABIPrevious TBI  +-------+-----------+-----------+------------+------------+  Right 1.25       0.60                                 +-------+-----------+-----------+------------+------------+  Left  1.14       0.68                                 +-------+-----------+-----------+------------+------------+         Summary:  Right: Resting right ankle-brachial index is within normal range. The  right toe-brachial index is abnormal.   Left: Resting left ankle-brachial index is within normal range. The left  toe-brachial index is abnormal.      Assessment/Plan:    48 year old female with recent left calf pain was ruled out for DVT and now has normal ABIs although somewhat decreased toe pressures bilaterally with palpable pulses and brisk capillary refill.  As such I do not think that her pain was related to anything vascular and her pain has thankfully resolved.  All of her questions were answered and she can see me on an as-needed basis.     Maeola Harman MD [facility]

## 2023-12-29 ENCOUNTER — Other Ambulatory Visit: Payer: Self-pay | Admitting: Family Medicine

## 2023-12-29 DIAGNOSIS — Z1231 Encounter for screening mammogram for malignant neoplasm of breast: Secondary | ICD-10-CM

## 2024-01-08 ENCOUNTER — Ambulatory Visit
Admission: RE | Admit: 2024-01-08 | Discharge: 2024-01-08 | Disposition: A | Discharge status: Home / Self Care | Payer: Managed Care, Other (non HMO) | Source: Ambulatory Visit | Attending: Family Medicine | Admitting: Family Medicine

## 2024-01-08 DIAGNOSIS — Z1231 Encounter for screening mammogram for malignant neoplasm of breast: Secondary | ICD-10-CM
